# Patient Record
Sex: Female | Born: 1935
Health system: Southern US, Community
[De-identification: ages and names within clinical notes are randomized; demographics above are authoritative.]

---

## 2013-09-01 ENCOUNTER — Inpatient Hospital Stay: Payer: Self-pay | Admitting: Specialist

## 2013-09-01 LAB — RAPID INFLUENZA A&B ANTIGENS

## 2013-09-01 LAB — CK TOTAL AND CKMB (NOT AT ARMC)
CK, Total: 37 U/L (ref 21–215)
CK-MB: 1.2 ng/mL (ref 0.5–3.6)

## 2013-09-01 LAB — CBC WITH DIFFERENTIAL/PLATELET
BASOS PCT: 1.2 %
Basophil #: 0.1 10*3/uL (ref 0.0–0.1)
EOS PCT: 1.2 %
Eosinophil #: 0.1 10*3/uL (ref 0.0–0.7)
HCT: 39.4 % (ref 35.0–47.0)
HGB: 12.4 g/dL (ref 12.0–16.0)
LYMPHS ABS: 0.6 10*3/uL — AB (ref 1.0–3.6)
LYMPHS PCT: 8.8 %
MCH: 26.5 pg (ref 26.0–34.0)
MCHC: 31.6 g/dL — ABNORMAL LOW (ref 32.0–36.0)
MCV: 84 fL (ref 80–100)
MONO ABS: 1.1 x10 3/mm — AB (ref 0.2–0.9)
Monocyte %: 16.9 %
Neutrophil #: 4.8 10*3/uL (ref 1.4–6.5)
Neutrophil %: 71.9 %
PLATELETS: 225 10*3/uL (ref 150–440)
RBC: 4.69 10*6/uL (ref 3.80–5.20)
RDW: 18.6 % — ABNORMAL HIGH (ref 11.5–14.5)
WBC: 6.7 10*3/uL (ref 3.6–11.0)

## 2013-09-01 LAB — LIPASE, BLOOD: Lipase: 61 U/L — ABNORMAL LOW (ref 73–393)

## 2013-09-01 LAB — URINALYSIS, COMPLETE
BLOOD: NEGATIVE
Bilirubin,UR: NEGATIVE
GLUCOSE, UR: NEGATIVE mg/dL (ref 0–75)
KETONE: NEGATIVE
Nitrite: NEGATIVE
Ph: 5 (ref 4.5–8.0)
Protein: NEGATIVE
Specific Gravity: 1.014 (ref 1.003–1.030)
Squamous Epithelial: 9
WBC UR: 9 /HPF (ref 0–5)

## 2013-09-01 LAB — COMPREHENSIVE METABOLIC PANEL
ALBUMIN: 3.1 g/dL — AB (ref 3.4–5.0)
ANION GAP: 3 — AB (ref 7–16)
Alkaline Phosphatase: 92 U/L
BILIRUBIN TOTAL: 0.4 mg/dL (ref 0.2–1.0)
BUN: 13 mg/dL (ref 7–18)
CHLORIDE: 102 mmol/L (ref 98–107)
CO2: 31 mmol/L (ref 21–32)
Calcium, Total: 8.6 mg/dL (ref 8.5–10.1)
Creatinine: 0.7 mg/dL (ref 0.60–1.30)
Glucose: 93 mg/dL (ref 65–99)
OSMOLALITY: 272 (ref 275–301)
Potassium: 3.9 mmol/L (ref 3.5–5.1)
SGOT(AST): 28 U/L (ref 15–37)
SGPT (ALT): 14 U/L (ref 12–78)
SODIUM: 136 mmol/L (ref 136–145)
TOTAL PROTEIN: 7.1 g/dL (ref 6.4–8.2)

## 2013-09-01 LAB — CK-MB
CK-MB: 0.6 ng/mL (ref 0.5–3.6)
CK-MB: 0.8 ng/mL (ref 0.5–3.6)
CK-MB: 1.1 ng/mL (ref 0.5–3.6)

## 2013-09-01 LAB — PROTIME-INR
INR: 2
Prothrombin Time: 22.6 secs — ABNORMAL HIGH (ref 11.5–14.7)

## 2013-09-01 LAB — TROPONIN I
TROPONIN-I: 0.17 ng/mL — AB
TROPONIN-I: 0.35 ng/mL — AB
Troponin-I: 0.33 ng/mL — ABNORMAL HIGH

## 2013-09-01 LAB — MAGNESIUM: Magnesium: 1.9 mg/dL

## 2013-09-01 LAB — PRO B NATRIURETIC PEPTIDE: B-Type Natriuretic Peptide: 4950 pg/mL — ABNORMAL HIGH (ref 0–450)

## 2013-09-02 DIAGNOSIS — I359 Nonrheumatic aortic valve disorder, unspecified: Secondary | ICD-10-CM

## 2013-09-02 LAB — CBC WITH DIFFERENTIAL/PLATELET
Basophil #: 0.1 10*3/uL (ref 0.0–0.1)
Basophil %: 1.2 %
Eosinophil #: 0 10*3/uL (ref 0.0–0.7)
Eosinophil %: 0.5 %
HCT: 34.4 % — AB (ref 35.0–47.0)
HGB: 11 g/dL — AB (ref 12.0–16.0)
Lymphocyte #: 0.8 10*3/uL — ABNORMAL LOW (ref 1.0–3.6)
Lymphocyte %: 17 %
MCH: 26.4 pg (ref 26.0–34.0)
MCHC: 32.1 g/dL (ref 32.0–36.0)
MCV: 82 fL (ref 80–100)
MONO ABS: 1.1 x10 3/mm — AB (ref 0.2–0.9)
MONOS PCT: 23.3 %
NEUTROS ABS: 2.7 10*3/uL (ref 1.4–6.5)
Neutrophil %: 58 %
PLATELETS: 181 10*3/uL (ref 150–440)
RBC: 4.18 10*6/uL (ref 3.80–5.20)
RDW: 18 % — AB (ref 11.5–14.5)
WBC: 4.6 10*3/uL (ref 3.6–11.0)

## 2013-09-02 LAB — BASIC METABOLIC PANEL
Anion Gap: 5 — ABNORMAL LOW (ref 7–16)
BUN: 11 mg/dL (ref 7–18)
CALCIUM: 8.5 mg/dL (ref 8.5–10.1)
CREATININE: 0.62 mg/dL (ref 0.60–1.30)
Chloride: 97 mmol/L — ABNORMAL LOW (ref 98–107)
Co2: 33 mmol/L — ABNORMAL HIGH (ref 21–32)
EGFR (African American): >60
EGFR (Non-African Amer.): >60
Glucose: 79 mg/dL (ref 65–99)
Osmolality: 268 (ref 275–301)
POTASSIUM: 3.3 mmol/L — AB (ref 3.5–5.1)
SODIUM: 135 mmol/L — AB (ref 136–145)

## 2013-09-03 LAB — POTASSIUM: Potassium: 3.4 mmol/L — ABNORMAL LOW (ref 3.5–5.1)

## 2013-09-04 LAB — POTASSIUM: Potassium: 4.1 mmol/L (ref 3.5–5.1)

## 2013-09-06 LAB — CULTURE, BLOOD (SINGLE)

## 2013-09-06 LAB — CREATININE, SERUM: CREATININE: 0.64 mg/dL (ref 0.60–1.30)

## 2013-09-06 LAB — HEMOGLOBIN: HGB: 12 g/dL (ref 12.0–16.0)

## 2014-12-20 NOTE — Discharge Summary (Signed)
PATIENT NAME:  Lindsay Riddle, Lindsay Riddle MR#:  [id number] DATE OF BIRTH:  1924/04/20  DATE OF ADMISSION:  09/01/2013 DATE OF DISCHARGE:  09/06/2013  For a detailed note, please take a look at the history and physical done on admission.   DIAGNOSES AT DISCHARGE: Are as follows, acute respiratory failure secondary to fluid and superimposed pneumonia. Flu. Pneumonia. Elevated troponin, likely secondary to demand ischemia.  History of chronic atrial fibrillation.  Osteoarthritis. Depression. History of diastolic congestive heart failure.   The patient is being discharged on a low-sodium diet.   ACTIVITY: As tolerated.   Follow up with his primary care physician in [place]. The patient is being discharged home with home health nursing services and physical therapy services.   DISCHARGE MEDICATIONS: Norvasc 5 mg daily, Coreg 12.5 mg b.i.d., Lasix 20 mg daily, Tylenol with hydrocodone 1 tab q.6 hours as needed, lisinopril 10 mg b.i.d., lorazepam 1 mg t.i.d. as needed, Nexium 40 mg daily, prednisone 5 mg daily, Xarelto 20 mg daily, Lexapro 5 mg daily, vitamin D3, 5000 International Units daily, Levaquin 250 mg daily x 5 days, guaifenesin 600 mg extended release tab b.i.d. dextromethorphan   with guaifenesin cough syrup 5 mL q.4-6 hours as needed.   PERTINENT STUDIES DONE DURING THE HOSPITAL COURSE: Are as follows, chest x-ray done on admission showing mild pulmonary edema, which is asymmetric, left lower lobe airspace disease and adjacent pleural effusion. CT scan of the chest done without contrast showing minimal peripheral increased interstitial markings over the mid to lower lungs, mild reticulonodular opacification over the left lower lobe, which may be due to atelectasis, superimposed on chronic interstitial lung disease. A very small amount of bilateral pleural fluid,  left greater than the right, moderate cardiomegaly, ascending thoracic aortic aneurysm measuring 4.9 cm, hiatal hernia. A two-dimensional  echocardiogram done showing ejection fraction of 55% to 60%, normal global LV systolic function, moderately dilated left atrium, moderately dilated right atrium, mild aortic regurgitation, mild to moderate aortic valve stenosis, moderately elevated pulmonary artery systolic pressure, mild tricuspid regurgitation, mildly dilated aortic root.   HOSPITAL COURSE: This is an 81 year old female with past medical history as mentioned above, presented to the hospital due to shortness of breath and weakness and noted to be in acute respiratory failure secondary to pneumonia and underlying flu.  1.  Acute respiratory failure. The patient presented to the hospital with tachypnea exertional shortness of breath. She was noted to have the flu and also noted to have superimposed pneumonia. The patient was treated with Tamiflu for her flu and also was placed on empiric Levaquin for superimposed pneumonia. After a few days of therapy with IV antibiotics and Tamiflu, her shortness of breath has significantly improved. She still has some exertional shortness of breath, but it has much improved since admission. She is not on oxygen at home. She does not have any evidence of chronic lung disease. She was ambulated on room air. She did desaturate, but this is likely secondary to the underlying respiratory illness, and it should improve over time. I did not discharge her on oxygen presently.  2.  Generalized weakness, cough and fever. This was likely secondary to flu and underlying pneumonia. The patient was treated with supportive care with antitussives, antipyretics  and IV Levaquin for the pneumonia and Tamiflu for the underlying flu. She was also given a flutter valve. She has significantly improved since admission. Her weakness has improved. She still has a cough, which is productive, but it is improved with the flutter  valve. She will continue antitussives and Mucinex as stated.  3.  Pneumonia. This was likely superimposed  pneumonia after the flu. The patient was treated with IV Levaquin. She will receive a total of 10 days of antibiotics. She has received 5 days here. She will continue to take 5 more days of oral Levaquin. Her blood cultures remain negative. She has been afebrile and hemodynamically stable.  4.  Elevated troponin. This was likely in the setting of demand ischemia from hypoxemia and the flu. The patient's troponin did slightly trend up, but she had no chest pain. She had no EKG changes. Her echocardiogram showed no evidence of any wall motion abnormalities.  5.  History of chronic atrial fib. The patient remained rate controlled. She will continue her Coreg. She is already on Xarelto. She was maintained on that, and she will continue it.  6.  History of depression. The patient was maintained on Lexapro. She will resume that.  7.  Gastroesophageal reflux disease. The patient was maintained on her Protonix and she will resume that too.  8.  History of diastolic congestive heart failure. The patient clinically did not have any evidence of congestive heart failure. She will continue her Lasix, beta blocker and ACE inhibitor as mentioned. Her echo showed normal LV function as mentioned.   The patient is a full code.   She is being discharged with home health nursing and physical therapy services.   Time spent is 45 minutes.   ____________________________ Rolly PancakeVivek J. Cherlynn KaiserSainani, MD vjs:dmm D: 09/06/2013 15:35:59 ET T: 09/06/2013 19:17:35 ET JOB#: [id number]  cc: Rolly PancakeVivek J. Cherlynn KaiserSainani, MD, <Dictator> Houston SirenVIVEK J Evva Din MD ELECTRONICALLY SIGNED 09/25/2013 11:24

## 2014-12-20 NOTE — H&P (Signed)
PATIENT NAME:  Lindsay Riddle, Lindsay Riddle MR#:  [id number] DATE OF BIRTH:  06/22/1924  DATE OF ADMISSION:  09/01/2013  PRIMARY CARE PHYSICIAN: Located in [place].   CHIEF COMPLAINT: Fever, generalized weakness and cough.   HISTORY OF PRESENT ILLNESS: This is an 81 year old female who presents to the hospital due to having a febrile illness now for the past two weeks. The patient's husband just passed away about 10 days to two weeks ago. The patient is visiting from [place]. She was having a respiratory illness, was treated with a Z-Pak, and had improved, but shortly thereafter got worse and started having worsening cough and then had low-grade fevers at home. She was also feeling increasingly weak and has not slept well in a few days due to her persistent cough. She was brought to the ER for further evaluation. The patient was noted to be positive for influenza A and also noted to have a mildly elevated troponin. Hospitalist services were contacted for further treatment and evaluation. The patient does admit to some mild chest pain, but only when she coughs. She denies any nausea, vomiting, abdominal pain, diarrhea. She does admit to fever of low-grade at 100.9. No weight gain, no weight loss. No paroxysmal nocturnal dyspnea, no orthopnea. No other associated symptoms presently.   REVIEW OF SYSTEMS:  CONSTITUTIONAL: Positive documented fever. Positive generalized weakness and fatigue. No weight gain, no weight loss.  EYES: No blurred or double vision.  ENT: No tinnitus, no postnasal drip. No redness of the oropharynx.  RESPIRATORY: Positive cough. No wheeze, no hemoptysis. Positive dyspnea on exertion.  CARDIOVASCULAR: No chest pain. No orthopnea. No palpitations, no syncope.  GASTROINTESTINAL: No nausea, vomiting, diarrhea. No abdominal pain. No melena or hematochezia.  GENITOURINARY: No dysuria or hematuria.  ENDOCRINE: No polyuria or nocturia. No heat or cold intolerance.  HEMATOLOGIC: No anemia, no  bruising, no bleeding.  INTEGUMENTARY: No rashes. No lesions.  MUSCULOSKELETAL: No arthritis. No swelling. No gout.  NEUROLOGIC: No numbness or tingling. No ataxia. No seizure-type activity.   PSYCHIATRIC: No anxiety, no insomnia. No ADD. Positive depression.   PAST MEDICAL HISTORY: Consistent with history of chronic atrial fibrillation, hypertension, osteoarthritis, depression, history of CHF, GERD.   ALLERGIES: No known drug allergies.   SOCIAL HISTORY: No smoking. No alcohol abuse. No illicit drug abuse. Lives at home with her husband until he just recently passed away.   FAMILY HISTORY: Both mother and father are deceased. Father died from old age. Mother had Parkinson's disease.   CURRENT MEDICATIONS: As follows: Tylenol with hydrocodone 5/325 1 tab q.6 hours as needed, Norvasc 5 mg daily, Coreg 12.5 mg t.i.d., Lasix 20 mg daily, Lexapro 5 mg daily, lisinopril 10 mg daily, lorazepam 1 mg t.i.d. as needed, Nexium 40 mg daily, prednisone 5 mg daily, vitamin D3 5000 international units daily, Xarelto 20 mg daily.   PHYSICAL EXAM:  VITAL SIGNS: Noted to be temperature is 99.6, pulse 72, respirations 21, blood pressure 153/69, sats 95% on 2 liters nasal cannula.  GENERAL: She is a lethargic -appearing female, but in no apparent distress.  HEENT: Atraumatic, normocephalic. Extraocular muscles are intact. Pupils equal and reactive to light. Sclerae anicteric. No conjunctival injection. No pharyngeal erythema.  NECK: Supple. There is no jugular venous distention. No bruits, no lymphadenopathy, no thyromegaly.  HEART: Irregular. No murmurs, no rubs, no clicks.  LUNGS: Clear to auscultation bilaterally. Poor respiratory effort. No dullness to percussion.  ABDOMEN: Soft, flat, nontender, nondistended. Has Boorman bowel sounds. No hepatosplenomegaly appreciated.  EXTREMITIES:  No evidence of any cyanosis, clubbing. Does have +1 pitting edema from the knees to ankles bilaterally, +2 pedal and radial  pulses bilaterally.  NEUROLOGICAL: The patient is alert, awake, and oriented x 3 with no focal motor or sensory deficits appreciated bilaterally.  SKIN: Moist and warm with no rashes appreciated.  LYMPHATIC: There is no cervical or axillary lymphadenopathy.   LABORATORY DATA: Showed a serum glucose of 93, BUN 13, creatinine 0.7, sodium 136, potassium 3.9, chloride 102, bicarbonate 31. LFTs are within normal limits. Troponin 0.17. White cell count 6.7, hemoglobin 12.3, hematocrit 39.4, platelet count 225. INR is 2.0. The patient is positive for the flu.   The patient did have a chest x-ray done which shows mild pulmonary edema with asymmetric left lower lobe airspace disease and adjacent pleural effusion.   ASSESSMENT AND PLAN: This is an 81 year old female with a history of chronic atrial fibrillation, hypertension, osteoarthritis, depression, history of congestive heart failure gastroesophageal reflux disease, who presents to the hospital due to fever generalized, weakness and a persistent cough and noted to be positive for the flu.  1. Generalized weakness, cough and fever. I suspect this is likely secondary to the viral illness from influenza. For now, I will continue supportive care with antitussives, antipyretics, and follow her clinically. I will go ahead and start her on Tamiflu and monitor.  2. Pneumonia. The patient likely has a superinfected pneumonia after having the flu. The patient does have a left lower lobe infiltrate on chest x-ray. I will start her on IV Levaquin, follow sputum and blood cultures. 3. Elevated troponin. This is likely in the setting of demand ischemia from hypoxemia from the flu. For now, I will place her on off unit  telemetry, follow serial cardiac markers. We will get a 2-dimensional echocardiogram. The patient is currently chest pain-free. EKG shows no acute ST or T wave changes. She has chronic atrial fibrillation.  4. History of chronic atrial fibrillation. The  patient is currently rate controlled. I will continue her Coreg. She is already on Xarelto which I will continue.  5. Depression. Continue Lexapro.  6 Gastroesophageal reflux disease. Continue Protonix.  7. History of congestive heart failure. Clinically, the patient does not appear to be in congestive heart failure. I will continue her Lasix, beta blocker and ACE for now.   CODE STATUS: THE PATIENT IS A FULL CODE.   TIME SPENT ON  ADMISSION: 50 minutes.  ____________________________ Rolly Pancake. Cherlynn Kaiser, MD vjs:sg D: 09/01/2013 08:53:47 ET T: 09/01/2013 10:32:31 ET JOB#: [id number]  cc: Rolly Pancake. Cherlynn Kaiser, MD, <Dictator> [place] Siren MD ELECTRONICALLY SIGNED 09/25/2013 11:23

## 2015-09-16 IMAGING — CT CT CHEST W/O CM
2 of 4 series · 15 of 36 positions shown, 18 images · non-contrast
Comparison: Chest x-ray 09/01/2013

CLINICAL DATA: Shortness of breath due to pneumonia/fluid. Hypoxia.
Chronic lung disease.

EXAM:
CT CHEST WITHOUT CONTRAST
TECHNIQUE: Multidetector CT imaging of the chest was performed following the
standard protocol without IV contrast.

[Series 2: routine chest wo · axial · 0.61mm/px · z∈[-439,-164]mm · 12 of 66 slices shown, 15 images]
[im 6/66  mediastinal]
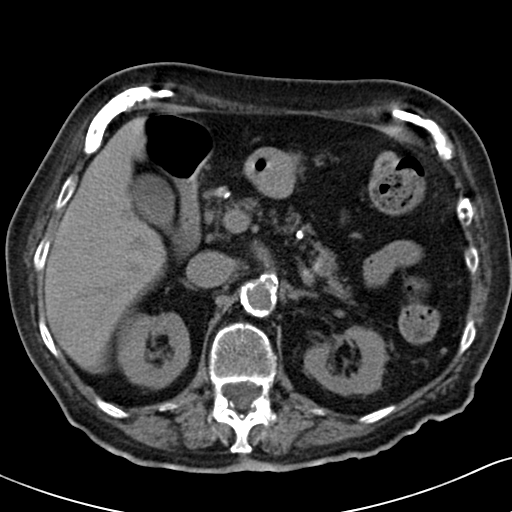
[im 6/66  lung]
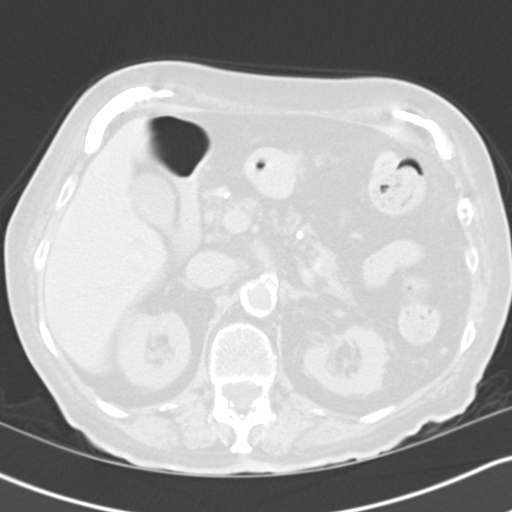
[im 11/66  lung]
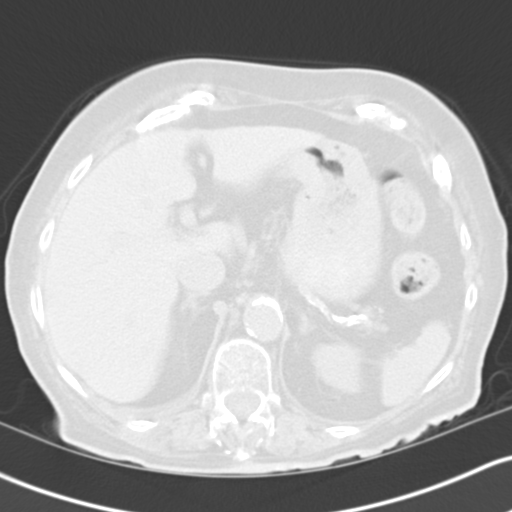
[im 16/66  lung]
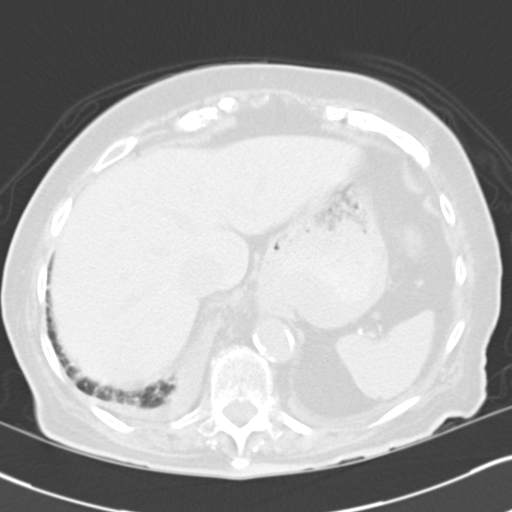
[im 21/66  lung]
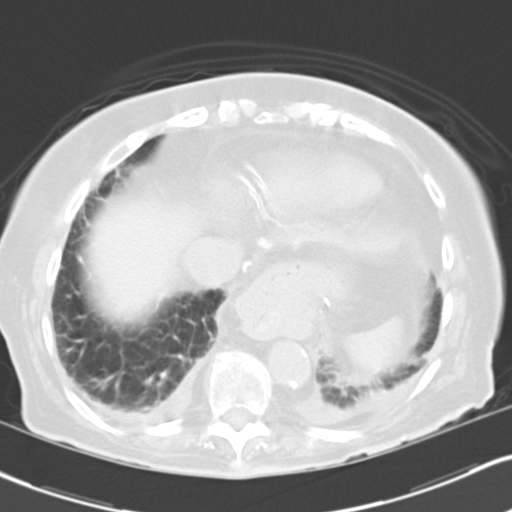
[im 26/66  mediastinal]
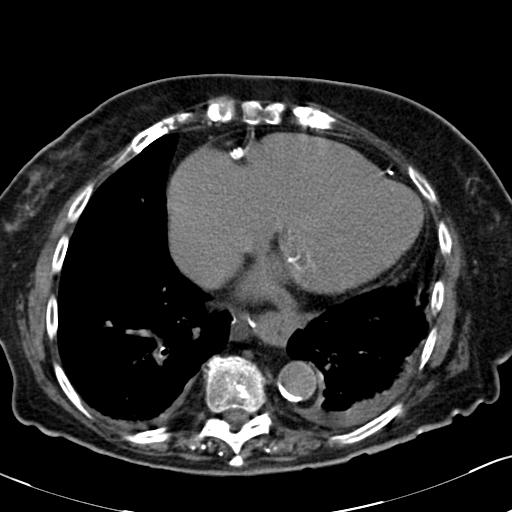
[im 26/66  lung]
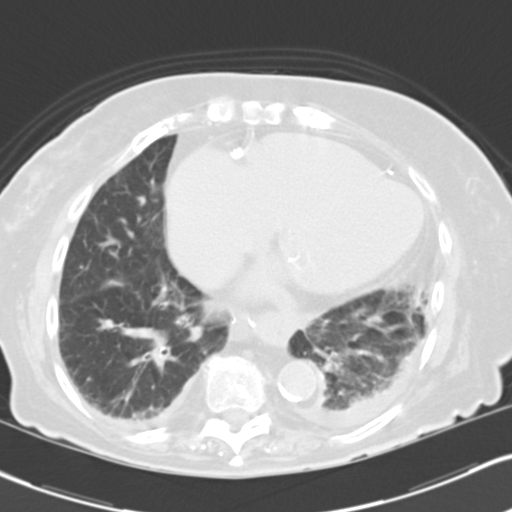
[im 31/66  lung]
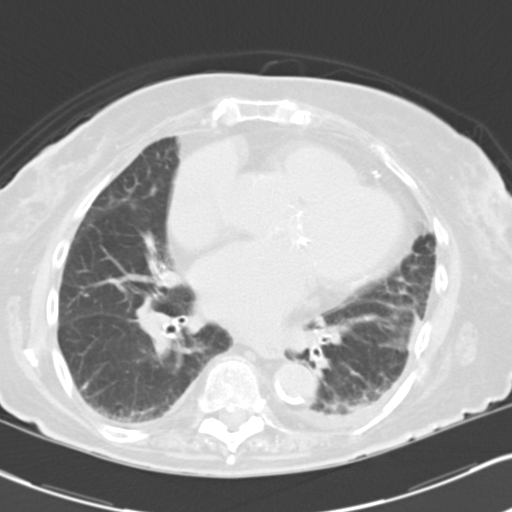
[im 36/66  lung]
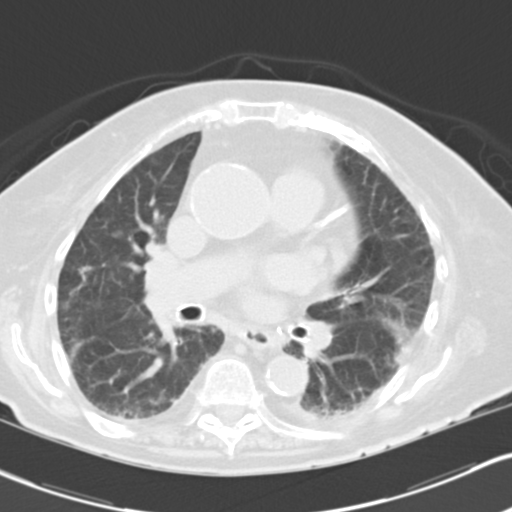
[im 41/66  lung]
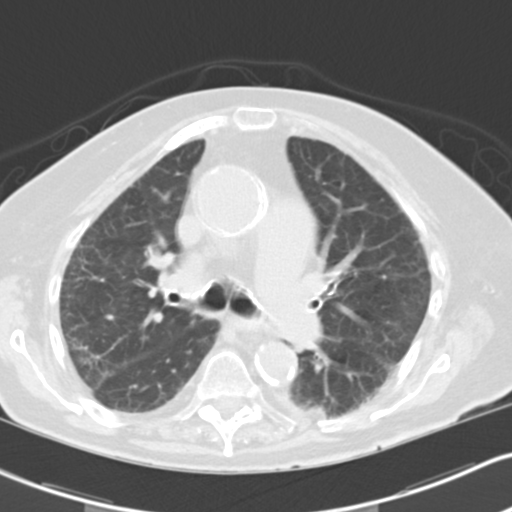
[im 46/66  mediastinal]
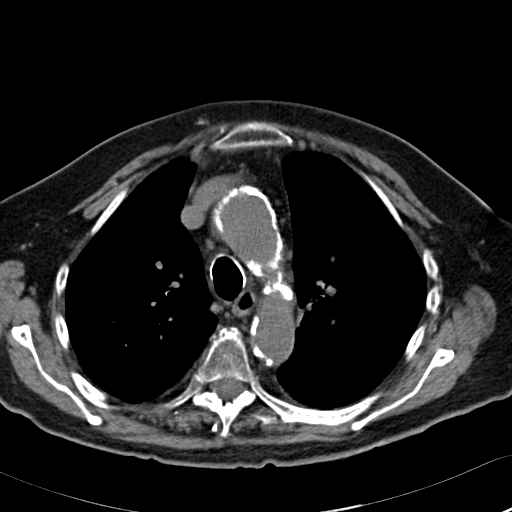
[im 46/66  lung]
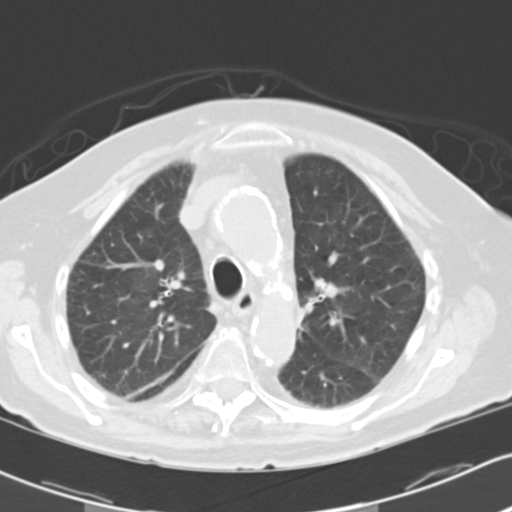
[im 51/66  lung]
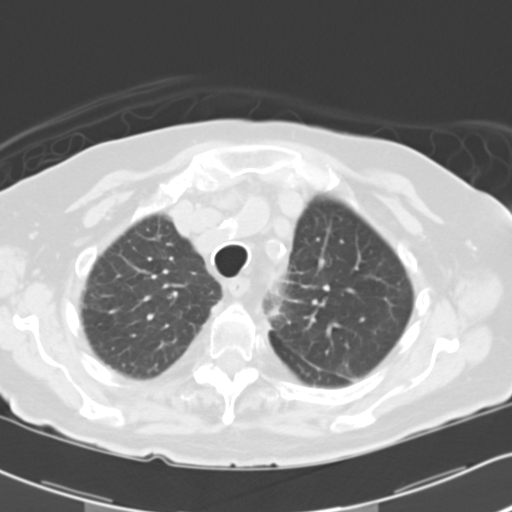
[im 56/66  lung]
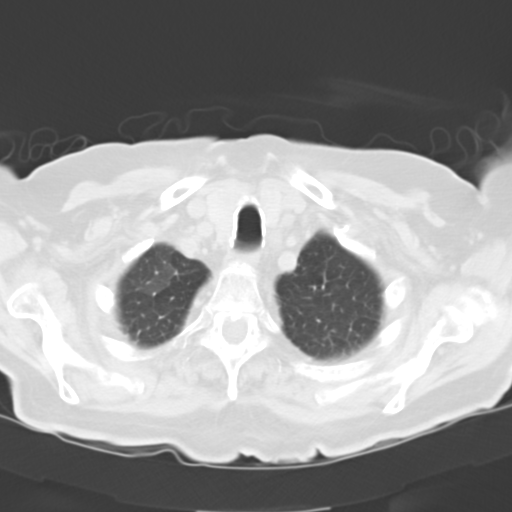
[im 61/66  lung]
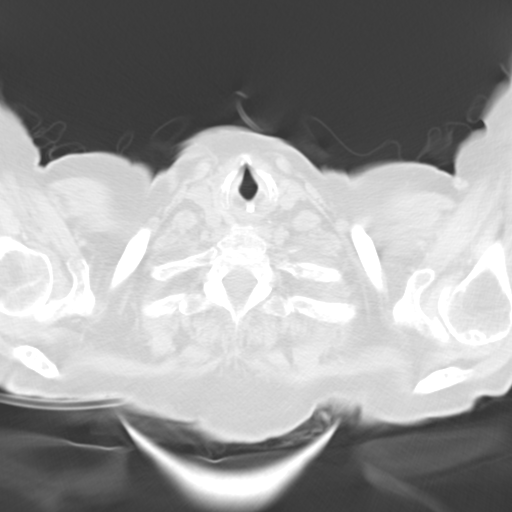

[Series 5: cor routine chest wo · coronal · 0.64mm/px · 3 of 120 slices shown]
[im 24/120  lung]
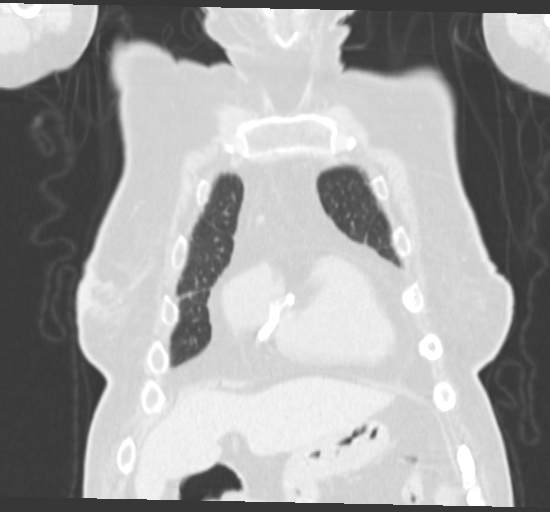
[im 48/120  lung]
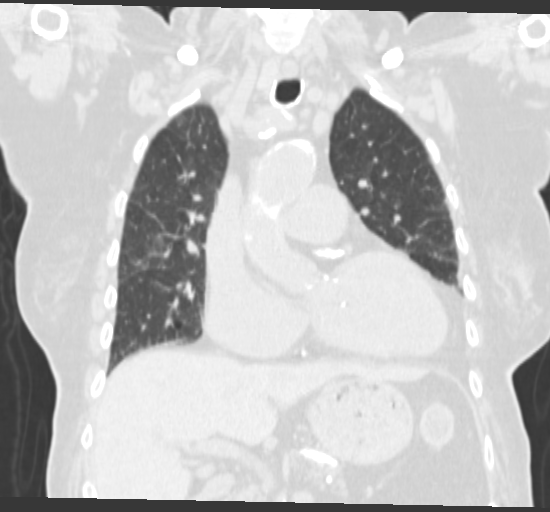
[im 72/120  lung]
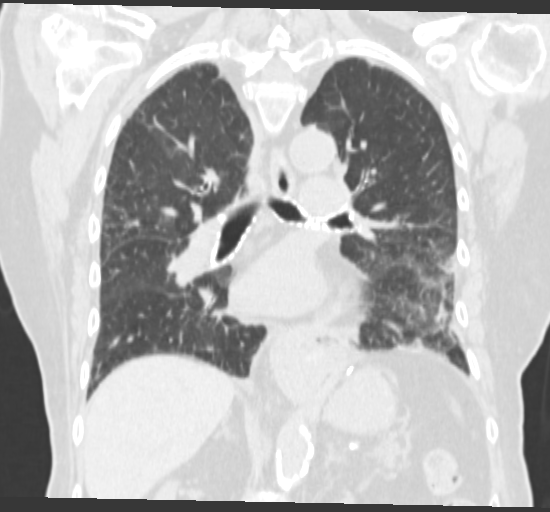

[15 of 36 positions shown; findings below may reference images not displayed]

FINDINGS: Lungs are adequately inflated with a small amount of bilateral
pleural fluid left greater than right. There are subtle increased
peripheral interstitial markings over the mid to lower lungs. There
is mild reticular nodular density in the left base which may be due
to atelectasis superimposed on chronic interstitial disease although
cannot completely exclude early infection.

There is a small to moderate hiatal hernia with surgical clips
adjacent the hiatal hernia. There is moderate cardiomegaly. There is
moderate to severe calcified plaque involving the coronary arteries.
There is dilatation of the ascending thoracic aorta measuring 4.9 cm
in AP diameter. There is moderate calcified plaque involving the
thoracic aorta. There is no mediastinal, hilar or axillary
adenopathy.

Images through the upper abdomen demonstrate moderate calcified
plaque involving the abdominal aorta. Mild cholelithiasis is
present. There is mild spondylosis of the spine.
IMPRESSION: Minimal peripheral increased interstitial markings over the mid to
lower lungs. Mild reticular nodular opacification over the left
lower lobe which may be due to atelectasis superimposed on chronic
interstitial disease although cannot exclude early infection. Very
small amount of bilateral pleural fluid left greater than right.

Moderate cardiomegaly. Three vessel coronary disease with moderate
to severe calcified plaque.

Ascending thoracic aortic aneurysm measuring 4.9 cm in AP diameter.

Hiatal hernia with postsurgical change adjacent the hiatal hernia.

Suggestion of mild cholelithiasis.
